# Patient Record
Sex: Female | Born: 1975 | Race: Black or African American | Hispanic: No | Marital: Single | Smoking: Never smoker
Health system: Southern US, Community
[De-identification: ages and names within clinical notes are randomized; demographics above are authoritative.]

## PROBLEM LIST (undated history)

## (undated) DIAGNOSIS — I10 Essential (primary) hypertension: Secondary | ICD-10-CM

## (undated) DIAGNOSIS — D869 Sarcoidosis, unspecified: Secondary | ICD-10-CM

## (undated) HISTORY — PX: APPENDECTOMY: SHX54

## (undated) HISTORY — PX: CHOLECYSTECTOMY: SHX55

---

## 2004-05-05 ENCOUNTER — Ambulatory Visit: Payer: Self-pay

## 2006-03-02 ENCOUNTER — Ambulatory Visit: Payer: Self-pay

## 2011-09-24 ENCOUNTER — Emergency Department: Payer: Self-pay | Admitting: Unknown Physician Specialty

## 2016-11-12 ENCOUNTER — Encounter (HOSPITAL_BASED_OUTPATIENT_CLINIC_OR_DEPARTMENT_OTHER): Payer: Self-pay | Admitting: *Deleted

## 2016-11-12 ENCOUNTER — Emergency Department (HOSPITAL_BASED_OUTPATIENT_CLINIC_OR_DEPARTMENT_OTHER)
Admission: EM | Admit: 2016-11-12 | Discharge: 2016-11-12 | Disposition: A | Discharge status: Home / Self Care | Payer: BC Managed Care – PPO | Attending: Emergency Medicine | Admitting: Emergency Medicine

## 2016-11-12 DIAGNOSIS — W57XXXA Bitten or stung by nonvenomous insect and other nonvenomous arthropods, initial encounter: Secondary | ICD-10-CM | POA: Insufficient documentation

## 2016-11-12 DIAGNOSIS — L299 Pruritus, unspecified: Secondary | ICD-10-CM | POA: Insufficient documentation

## 2016-11-12 DIAGNOSIS — I10 Essential (primary) hypertension: Secondary | ICD-10-CM | POA: Diagnosis not present

## 2016-11-12 DIAGNOSIS — Z79899 Other long term (current) drug therapy: Secondary | ICD-10-CM | POA: Diagnosis not present

## 2016-11-12 HISTORY — DX: Sarcoidosis, unspecified: D86.9

## 2016-11-12 HISTORY — DX: Essential (primary) hypertension: I10

## 2016-11-12 MED ORDER — HYDROCORTISONE 1 % EX CREA
TOPICAL_CREAM | Freq: Two times a day (BID) | CUTANEOUS | Status: DC
  Administered 2016-11-12: 07:00:00 via TOPICAL
  Filled 2016-11-12: qty 28

## 2016-11-12 MED ORDER — CETIRIZINE HCL 5 MG/5ML PO SOLN
10.0000 mg | Freq: Once | ORAL | Status: AC
  Administered 2016-11-12: 10 mg via ORAL
  Filled 2016-11-12: qty 10

## 2016-11-12 NOTE — ED Notes (Signed)
Pt verbalizes understanding of dc instructions and denies any further need at this time

## 2016-11-12 NOTE — ED Provider Notes (Signed)
[facility] Provider Note: Nicole Spurling, MD, FACEP  CSN: [id number] MRN: [id number] ARRIVAL: 11/12/16 at [place]: Polk is a 41 y.o. female who felt something bite the back of her right thigh while in bed this morning about 4 AM. She has had itching at the site but no pain. She is having no systemic symptoms such as fever, chills, nausea or vomiting. Symptoms are mild at the present time but she was to make sure that it does not get infected. She did not see what it was that bit her but thinks it may been a spider as she felt crawling on her skin.    Past Medical History:  Diagnosis Date  . Hypertension   . Sarcoidosis     Past Surgical History:  Procedure Laterality Date  . APPENDECTOMY    . CHOLECYSTECTOMY      History reviewed. No pertinent family history.  Social History  Substance Use Topics  . Smoking status: Never Smoker  . Smokeless tobacco: Never Used  . Alcohol use No    Prior to Admission medications   Medication Sig Start Date End Date Taking? Authorizing Provider  lisinopril (PRINIVIL,ZESTRIL) 30 MG tablet Take 30 mg by mouth daily.   Yes [provider]  Multiple Vitamins-Minerals (MULTIVITAMIN WITH MINERALS) tablet Take 1 tablet by mouth daily.   Yes [provider]    Allergies Patient has no allergy information on record.   REVIEW OF SYSTEMS  Negative except as noted here or in the History of Present Illness.   PHYSICAL EXAMINATION  Initial Vital Signs Blood pressure (!) 142/98, pulse 66, temperature 98.1 F (36.7 C), temperature source Oral, resp. rate 18, height 5' 10"  (1.778 m), weight 106.6 kg (235 lb), last menstrual period 11/01/2016, SpO2 100 %.  Examination General: Well-developed, well-nourished female in no acute distress; appearance consistent with age of record HENT: normocephalic; atraumatic Eyes: Normal  appearance Neck: supple Heart: regular rate and rhythm Lungs: clear to auscultation bilaterally Abdomen: soft; nondistended; nontender; bowel sounds present Extremities: No deformity; full range of motion; pulses normal Neurologic: Awake, alert and oriented; motor function intact in all extremities and symmetric; no facial droop Skin: Warm and dry; tiny puncture wound posterior right thigh with slight surrounding erythema, no foreign body seen Psychiatric: Normal mood and affect   RESULTS  Summary of this visit's results, reviewed by myself:   EKG Interpretation  Date/Time:    Ventricular Rate:    PR Interval:    QRS Duration:   QT Interval:    QTC Calculation:   R Axis:     Text Interpretation:        Laboratory Studies: No results found for this or any previous visit (from the past 24 hour(s)). Imaging Studies: No results found.  ED COURSE  Nursing notes and initial vitals signs, including pulse oximetry, reviewed.  Vitals:   11/12/16 0617  BP: (!) 142/98  Pulse: 66  Resp: 18  Temp: 98.1 F (36.7 C)  TempSrc: Oral  SpO2: 100%  Weight: 106.6 kg (235 lb)  Height: 5' 10"  (1.778 m)   Patient advised to use over-the-counter Zyrtec as needed for itching. We will provide hydrocortisone cream to use twice daily. She was also advised to apply Neosporin or other antibiotic cream or ointment several times a day to prevent infection.  PROCEDURES    ED DIAGNOSES  ICD-10-CM   1. Insect bite, initial encounter W57.Paulette Blanch, Jenny Reichmann, MD 11/12/16 [id number]

## 2016-11-12 NOTE — ED Triage Notes (Signed)
Pt woke this am with what she is concerned is a spider bite to the back of the right thigh. Denies fever N/V rashes difficulty breathing or facial swelling

## 2019-07-14 ENCOUNTER — Ambulatory Visit: Payer: BC Managed Care – PPO | Attending: Internal Medicine

## 2019-07-14 DIAGNOSIS — Z23 Encounter for immunization: Secondary | ICD-10-CM

## 2019-07-14 NOTE — Progress Notes (Signed)
   Covid-19 Vaccination Clinic  Name:  Nicole Brady    MRN: [id number] DOB: 24-Aug-1975  07/14/2019  [name] was observed post Covid-19 immunization for 15 minutes without incident. She was provided with Vaccine Information Sheet and instruction to access the V-Safe system.   [name] was instructed to call 911 with any severe reactions post vaccine: Marland Kitchen Difficulty breathing  . Swelling of face and throat  . A fast heartbeat  . A bad rash all over body  . Dizziness and weakness   Immunizations Administered    Name Date Dose VIS Date Route   Pfizer COVID-19 Vaccine 07/14/2019 10:00 AM 0.3 mL 04/06/2019 Intramuscular   Manufacturer: [employer], [employer]   Lot: [id number]   [place]: [id number]

## 2019-08-07 ENCOUNTER — Ambulatory Visit: Payer: BC Managed Care – PPO | Attending: Internal Medicine

## 2019-08-07 DIAGNOSIS — Z23 Encounter for immunization: Secondary | ICD-10-CM

## 2019-08-07 NOTE — Progress Notes (Signed)
   Covid-19 Vaccination Clinic  Name:  Nicole Brady    MRN: [id number] DOB: [birth year]  08/07/2019  [name] was observed post Covid-19 immunization for 15 minutes without incident. She was provided with Vaccine Information Sheet and instruction to access the V-Safe system.   [name] was instructed to call 911 with any severe reactions post vaccine: Marland Kitchen Difficulty breathing  . Swelling of face and throat  . A fast heartbeat  . A bad rash all over body  . Dizziness and weakness   Immunizations Administered    Name Date Dose VIS Date Route   Pfizer COVID-19 Vaccine 08/07/2019  3:16 PM 0.3 mL 04/06/2019 Intramuscular   Manufacturer: [place]   Lot: [id number]   [place]: [id number]

## 2021-02-25 ENCOUNTER — Other Ambulatory Visit: Payer: Self-pay

## 2021-02-25 ENCOUNTER — Ambulatory Visit (INDEPENDENT_AMBULATORY_CARE_PROVIDER_SITE_OTHER): Payer: BC Managed Care – PPO

## 2021-02-25 ENCOUNTER — Ambulatory Visit: Payer: BC Managed Care – PPO | Admitting: Podiatry

## 2021-02-25 DIAGNOSIS — M21612 Bunion of left foot: Secondary | ICD-10-CM

## 2021-02-25 DIAGNOSIS — M21611 Bunion of right foot: Secondary | ICD-10-CM | POA: Diagnosis not present

## 2021-02-25 DIAGNOSIS — M2041 Other hammer toe(s) (acquired), right foot: Secondary | ICD-10-CM

## 2021-02-25 DIAGNOSIS — M2042 Other hammer toe(s) (acquired), left foot: Secondary | ICD-10-CM

## 2021-02-25 DIAGNOSIS — R2242 Localized swelling, mass and lump, left lower limb: Secondary | ICD-10-CM

## 2021-03-01 NOTE — Progress Notes (Signed)
  Subjective:  Patient ID: Nicole Brady, female    DOB: May 07, 1975,  MRN: [id number]  Chief Complaint  Patient presents with   Bunions    NP - discuss surgical correction   Hammer Toe    45 y.o. female presents with the above complaint. History confirmed with patient.  She is here today to evaluate bilateral foot pain which she thinks may be bunions as well as possible hammertoes.  The left foot is much worse than the right. Objective:  Physical Exam: warm, good capillary refill, no trophic changes or ulcerative lesions, normal DP and PT pulses, and normal sensory exam.  No notable bunion or hammertoe deformity Left Foot: She has pain with patient in the plantar fascia   Radiographs: Multiple views x-ray of both feet: No acute osseous abnormalities Assessment:   1. Bilateral bunions   2. Hammertoes of both feet      Plan:  Patient was evaluated and treated and all questions answered.  She is a painful area worse in the left foot.  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  I recommended MRI to image it.  We discussed imaging prior to intervention whether that is injection or surgical resection.  She will return to see me after the MRI.  Return for call for appointment after MRI to review.

## 2021-03-24 ENCOUNTER — Other Ambulatory Visit: Payer: BC Managed Care – PPO

## 2021-03-26 ENCOUNTER — Ambulatory Visit
Admission: RE | Admit: 2021-03-26 | Discharge: 2021-03-26 | Disposition: A | Discharge status: Home / Self Care | Payer: BC Managed Care – PPO | Source: Ambulatory Visit | Attending: Podiatry | Admitting: Podiatry

## 2021-03-26 ENCOUNTER — Other Ambulatory Visit: Payer: Self-pay

## 2021-03-26 DIAGNOSIS — R2242 Localized swelling, mass and lump, left lower limb: Secondary | ICD-10-CM

## 2021-03-26 MED ORDER — GADOBENATE DIMEGLUMINE 529 MG/ML IV SOLN
20.0000 mL | Freq: Once | INTRAVENOUS | Status: AC | PRN
  Administered 2021-03-26: 20 mL via INTRAVENOUS

## 2021-04-01 ENCOUNTER — Other Ambulatory Visit: Payer: Self-pay

## 2021-04-01 ENCOUNTER — Encounter: Payer: Self-pay | Admitting: Podiatry

## 2021-04-01 ENCOUNTER — Ambulatory Visit: Payer: BC Managed Care – PPO | Admitting: Podiatry

## 2021-04-01 DIAGNOSIS — M21611 Bunion of right foot: Secondary | ICD-10-CM | POA: Diagnosis not present

## 2021-04-01 DIAGNOSIS — M21612 Bunion of left foot: Secondary | ICD-10-CM | POA: Diagnosis not present

## 2021-04-01 NOTE — Patient Instructions (Signed)
Look for silicone bunion pads or bunion shield on [employer] and wear this over your big toe to pad it in shoes

## 2021-04-02 ENCOUNTER — Encounter: Payer: Self-pay | Admitting: Podiatry

## 2021-04-02 NOTE — Progress Notes (Signed)
  Subjective:  Patient ID: Nicole Brady, female    DOB: [birth year],  MRN: [id number]  Chief Complaint  Patient presents with   Foot Pain    "It's doing okay.  It's not hurting as bad as when I first came in."    44 y.o. female presents with the above complaint. History confirmed with patient.  She completed the MRI she is doing much better than she was Objective:  Physical Exam: warm, good capillary refill, no trophic changes or ulcerative lesions, normal DP and PT pulses, and normal sensory exam.  No notable bunion or hammertoe deformity Left Foot: She has pain with patient in the plantar fascia   Radiographs: Multiple views x-ray of both feet: No acute osseous abnormalities  MRI normal without osseous or soft tissue abnormalities Assessment:   1. Bilateral bunions      Plan:  Patient was evaluated and treated and all questions answered.  Overall she is improved quite a bit.  I discussed with her that it still unclear exactly what the exact source of pain was.  She does have some tenderness over bunions that she has on both feet.  We discussed surgical and nonsurgical treatment I recommended offloading with silicone pads which she will try.  She will return to see me as needed for this or other issues  Return if symptoms worsen or fail to improve.

## 2022-11-27 ENCOUNTER — Ambulatory Visit
Admission: RE | Admit: 2022-11-27 | Discharge: 2022-11-27 | Disposition: A | Discharge status: Home / Self Care | Payer: BC Managed Care – PPO | Source: Ambulatory Visit

## 2022-11-27 VITALS — BP 171/90 | HR 61 | Temp 97.9°F | Resp 16 | Wt 205.0 lb

## 2022-11-27 DIAGNOSIS — M7651 Patellar tendinitis, right knee: Secondary | ICD-10-CM

## 2022-11-27 DIAGNOSIS — S46912A Strain of unspecified muscle, fascia and tendon at shoulder and upper arm level, left arm, initial encounter: Secondary | ICD-10-CM | POA: Diagnosis not present

## 2022-11-27 MED ORDER — PREDNISONE 20 MG PO TABS: 20.0000 mg | ORAL_TABLET | Freq: Every day | ORAL | 0 refills | Status: DC

## 2022-11-27 NOTE — ED Provider Notes (Signed)
[facility]    CSN: [id number] Arrival date & time: 11/27/22  1244      History   Chief Complaint Chief Complaint  Patient presents with   Arm Injury    I think I hurt my left arm lifting too heavy a weight about 3 weeks ago. I've iced it and taken Tylenol. Still hurts some of the time. - Entered by patient    HPI Nicole Brady is a 47 y.o. female with upper lateral and posterior arm pain since she was working out and increase the wt by 5 lbs 3 weeks ago. She has iced it, did less weight exercises and iced it, and is 50 % better, but has not resolved. The pain is provoked with arm movement. Does not have shoulder pain.  \ 2- sometimes gets pain above patella on thigh region anteriorly when she does work outs climbing, or steps. She takes Tylenol and ices it as needed. Is not painful today.   Past Medical History:  Diagnosis Date   Hypertension    Sarcoidosis     There are no problems to display for this patient.   Past Surgical History:  Procedure Laterality Date   APPENDECTOMY     CHOLECYSTECTOMY      OB History   No obstetric history on file.      Home Medications    Prior to Admission medications   Medication Sig Start Date End Date Taking? Authorizing Provider  ferrous sulfate 325 (65 FE) MG tablet Take by mouth.   Yes [provider]  lisinopril-hydrochlorothiazide (ZESTORETIC) 20-25 MG tablet Take 2 tablets by mouth daily. 11/18/20  Yes [provider]  Multiple Vitamins-Minerals (MULTIVITAMIN WITH MINERALS) tablet Take 1 tablet by mouth daily.   Yes [provider]  predniSONE (DELTASONE) 20 MG tablet Take 1 tablet (20 mg total) by mouth daily with breakfast. 11/27/22  Yes Rodriguez-Southworth, Nettie Elm, PA-C  lisinopril (PRINIVIL,ZESTRIL) 30 MG tablet Take 30 mg by mouth daily.    [provider]    Family History History reviewed. No pertinent family history.  Social History Social History   Tobacco  Use   Smoking status: Never   Smokeless tobacco: Never  Vaping Use   Vaping status: Never Used  Substance Use Topics   Alcohol use: No   Drug use: No     Allergies   Patient has no known allergies.   Review of Systems Review of Systems As noted in HPI  Physical Exam Triage Vital Signs Repeated BP 158/90  ED Triage Vitals  Encounter Vitals Group     BP 11/27/22 1254 (!) 171/90     Systolic BP Percentile --      Diastolic BP Percentile --      Pulse Rate 11/27/22 1254 61     Resp 11/27/22 1254 16     Temp 11/27/22 1254 97.9 F (36.6 C)     Temp Source 11/27/22 1254 Oral     SpO2 11/27/22 1254 100 %     Weight 11/27/22 1253 205 lb (93 kg)     Height --      Head Circumference --      Peak Flow --      Pain Score 11/27/22 1252 5     Pain Loc --      Pain Education --      Exclude from Growth Chart --    No data found.  Updated Vital Signs BP (!) 171/90 (BP Location: Left Arm)  Pulse 61   Temp 97.9 F (36.6 C) (Oral)   Resp 16   Wt 205 lb (93 kg)   SpO2 100%   BMI 29.41 kg/m   Visual Acuity Right Eye Distance:   Left Eye Distance:   Bilateral Distance:    Right Eye Near:   Left Eye Near:    Bilateral Near:     Physical Exam Vitals and nursing note reviewed.  Constitutional:      Appearance: She is not toxic-appearing.  HENT:     Right Ear: External ear normal.     Left Ear: External ear normal.  Eyes:     General: No scleral icterus.    Conjunctiva/sclera: Conjunctivae normal.  Pulmonary:     Effort: Pulmonary effort is normal.  Musculoskeletal:        General: Normal range of motion.     Cervical back: Neck supple.     Comments: L UE- has local tenderness on deltoid when arm is extended with palpation, not so much when not extended. And tenderness on distal triceps with palpation. No deformity notes. Strength is normal.   R KNEE- exam is normal, unable to provoke her pain  Skin:    General: Skin is warm and dry.     Findings: No  bruising or rash.  Neurological:     Mental Status: She is alert and oriented to person, place, and time.     Motor: No weakness.     Gait: Gait normal.     Deep Tendon Reflexes: Reflexes normal.  Psychiatric:        Mood and Affect: Mood normal.        Behavior: Behavior normal.        Thought Content: Thought content normal.        Judgment: Judgment normal.      UC Treatments / Results  Labs (all labs ordered are listed, but only abnormal results are displayed) Labs Reviewed - No data to display  EKG   Radiology No results found.  Procedures Procedures (including critical care time)  Medications Ordered in UC Medications - No data to display  Initial Impression / Assessment and Plan / UC Course  I have reviewed the triage vital signs and the nursing notes.  R Suprapatellar tendonitis L upper arm strain  I placed her on Prednisone for a few days since due to elevated BP, she should not have NSAID's right now.  See instructions   Final Clinical Impressions(s) / UC Diagnoses   Final diagnoses:  Muscle strain of left upper arm, initial encounter  Patellar tendinitis of right knee     Discharge Instructions      Follow up with your PCP, to have PT ordered for faster healing      ED Prescriptions     Medication Sig Dispense Auth. Provider   predniSONE (DELTASONE) 20 MG tablet Take 1 tablet (20 mg total) by mouth daily with breakfast. 5 tablet Rodriguez-Southworth, Nettie Elm, PA-C      PDMP not reviewed this encounter.   Garey Ham, PA-C 11/27/22 1414

## 2022-11-27 NOTE — Discharge Instructions (Signed)
Follow up with your PCP, to have PT ordered for faster healing

## 2022-11-27 NOTE — ED Triage Notes (Addendum)
  I think I hurt my left arm lifting too heavy a weight about 3 weeks ago. I've iced it and taken Tylenol. Still hurts some of the time  Right knee pain also.

## 2023-04-30 ENCOUNTER — Encounter: Payer: Self-pay | Admitting: Emergency Medicine

## 2023-04-30 ENCOUNTER — Ambulatory Visit
Admission: EM | Admit: 2023-04-30 | Discharge: 2023-04-30 | Disposition: A | Discharge status: Home / Self Care | Payer: 59 | Attending: Family Medicine | Admitting: Family Medicine

## 2023-04-30 DIAGNOSIS — U071 COVID-19: Secondary | ICD-10-CM | POA: Diagnosis not present

## 2023-04-30 LAB — RESP PANEL BY RT-PCR (FLU A&B, COVID) ARPGX2
Influenza A by PCR: NEGATIVE
Influenza B by PCR: NEGATIVE
SARS Coronavirus 2 by RT PCR: POSITIVE — AB

## 2023-04-30 LAB — GROUP A STREP BY PCR: Group A Strep by PCR: NOT DETECTED

## 2023-04-30 MED ORDER — PROMETHAZINE-DM 6.25-15 MG/5ML PO SYRP: 5.0000 mL | ORAL_SOLUTION | Freq: Four times a day (QID) | ORAL | 0 refills | Status: AC | PRN

## 2023-04-30 MED ORDER — NIRMATRELVIR/RITONAVIR (PAXLOVID)TABLET: ORAL_TABLET | ORAL | 0 refills | Status: AC

## 2023-04-30 NOTE — ED Triage Notes (Signed)
Patient c/o cough, sore throat, and nasal congestion that started yesterday.  Patient unsure of fevers.

## 2023-04-30 NOTE — ED Provider Notes (Signed)
 [facility]    CSN: [id number] Arrival date & time: 04/30/23  0830      History   Chief Complaint Chief Complaint  Patient presents with   Sore Throat   Cough   Nasal Congestion    HPI 48 year old female presents with respiratory symptoms.  Symptoms over the past 2 days.  Reports cough, sore throat, congestion.  No fever.  She has been using over-the-counter treatment without resolution.  Cough worse at night.  No known exacerbating factors.  No other associated symptoms.  No other complaints.  Home Medications    Prior to Admission medications   Medication Sig Start Date End Date Taking? Authorizing Provider  amLODipine (NORVASC) 5 MG tablet Take 5 mg by mouth daily. 03/03/23  Yes [provider]  nirmatrelvir /ritonavir  (PAXLOVID ) 20 x 150 MG & 10 x 100MG  TABS Take nirmatrelvir  (150 mg) two tablets twice daily for 5 days and ritonavir  (100 mg) one tablet twice daily for 5 days. GFR greater than 90. 04/30/23  Yes Kaulin Chaves G, DO  promethazine -dextromethorphan (PROMETHAZINE -DM) 6.25-15 MG/5ML syrup Take 5 mLs by mouth 4 (four) times daily as needed for cough. 04/30/23  Yes Abdulkadir Emmanuel G, DO  ferrous sulfate 325 (65 FE) MG tablet Take by mouth.    [provider]  lisinopril-hydrochlorothiazide (ZESTORETIC) 20-25 MG tablet Take 2 tablets by mouth daily. 11/18/20   [provider]  Multiple Vitamins-Minerals (MULTIVITAMIN WITH MINERALS) tablet Take 1 tablet by mouth daily.    [provider]    Family History History reviewed. No pertinent family history.  Social History Social History   Tobacco Use   Smoking status: Never   Smokeless tobacco: Never  Vaping Use   Vaping status: Never Used  Substance Use Topics   Alcohol use: No   Drug use: No     Allergies   Patient has no known allergies.   Review of Systems Review of Systems Per HPI  Physical Exam Triage Vital Signs ED Triage Vitals  Encounter Vitals Group      BP 04/30/23 0900 (!) 167/93     Systolic BP Percentile --      Diastolic BP Percentile --      Pulse Rate 04/30/23 0900 64     Resp 04/30/23 0900 14     Temp 04/30/23 0900 98.7 F (37.1 C)     Temp Source 04/30/23 0900 Oral     SpO2 04/30/23 0900 97 %     Weight 04/30/23 0859 210 lb (95.3 kg)     Height 04/30/23 0859 5' 10 (1.778 m)     Head Circumference --      Peak Flow --      Pain Score 04/30/23 0858 5     Pain Loc --      Pain Education --      Exclude from Growth Chart --    No data found.  Updated Vital Signs BP (!) 167/93 (BP Location: Left Arm) Comment: Patient has not taken her BP medicine today.  Pulse 64   Temp 98.7 F (37.1 C) (Oral)   Resp 14   Ht 5' 10 (1.778 m)   Wt 95.3 kg   LMP 04/02/2023 (Approximate)   SpO2 97%   BMI 30.13 kg/m   Visual Acuity Right Eye Distance:   Left Eye Distance:   Bilateral Distance:    Right Eye Near:   Left Eye Near:    Bilateral Near:     Physical Exam Vitals  and nursing note reviewed.  Constitutional:      General: She is not in acute distress.    Appearance: Normal appearance.  HENT:     Head: Normocephalic and atraumatic.     Right Ear: Tympanic membrane normal.     Left Ear: Tympanic membrane normal.     Mouth/Throat:     Pharynx: Oropharynx is clear.  Eyes:     General:        Right eye: No discharge.        Left eye: No discharge.     Conjunctiva/sclera: Conjunctivae normal.  Cardiovascular:     Rate and Rhythm: Normal rate and regular rhythm.  Pulmonary:     Effort: Pulmonary effort is normal.     Breath sounds: Normal breath sounds. No wheezing, rhonchi or rales.  Neurological:     Mental Status: She is alert.  Psychiatric:        Mood and Affect: Mood normal.        Behavior: Behavior normal.      UC Treatments / Results  Labs (all labs ordered are listed, but only abnormal results are displayed) Labs Reviewed  RESP PANEL BY RT-PCR (FLU A&B, COVID) ARPGX2 - Abnormal; Notable for the  following components:      Result Value   SARS Coronavirus 2 by RT PCR POSITIVE (*)    All other components within normal limits  GROUP A STREP BY PCR    EKG   Radiology No results found.  Procedures Procedures (including critical care time)  Medications Ordered in UC Medications - No data to display  Initial Impression / Assessment and Plan / UC Course  I have reviewed the triage vital signs and the nursing notes.  Pertinent labs & imaging results that were available during my care of the patient were reviewed by me and considered in my medical decision making (see chart for details).    48 year old female presents with respiratory symptoms.  COVID-19 testing positive.  Treating with Paxlovid .  Promethazine  DM for cough.  Final Clinical Impressions(s) / UC Diagnoses   Final diagnoses:  COVID   Discharge Instructions   None    ED Prescriptions     Medication Sig Dispense Auth. Provider   nirmatrelvir /ritonavir  (PAXLOVID ) 20 x 150 MG & 10 x 100MG  TABS Take nirmatrelvir  (150 mg) two tablets twice daily for 5 days and ritonavir  (100 mg) one tablet twice daily for 5 days. GFR greater than 90. 30 tablet Travarus Trudo G, DO   promethazine -dextromethorphan (PROMETHAZINE -DM) 6.25-15 MG/5ML syrup Take 5 mLs by mouth 4 (four) times daily as needed for cough. 118 mL Zackarie Chason G, DO      PDMP not reviewed this encounter.   Avelina Mcclurkin G, [place] 04/30/23 [phone]

## 2023-06-30 IMAGING — MR MR FOOT*L* WO/W CM
8 of 9 series · 33 of 40 positions shown · IV contrast (multihance)
Comparison: Radiograph 02/25/2021

CLINICAL DATA: Soft tissue mass, foot, US/xray nondiagnostic

EXAM:
MRI OF THE LEFT FOREFOOT WITHOUT AND WITH CONTRAST
TECHNIQUE: Multiplanar, multisequence MR imaging of the left forefoot was
performed both before and after administration of intravenous
contrast.
CONTRAST:  20mL MULTIHANCE GADOBENATE DIMEGLUMINE 529 MG/ML IV SOLN

[Series 4: T1 · coronal · 3.0mm · 0.19mm/px · 5 of 36 slices shown (1 of 2)]
[im 1/36]
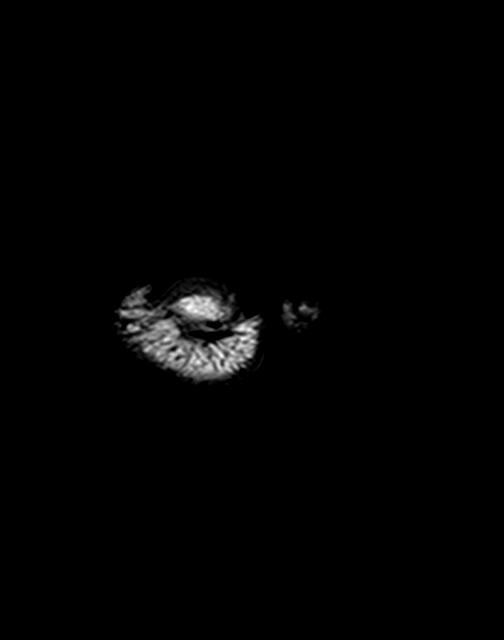
[im 9/36]
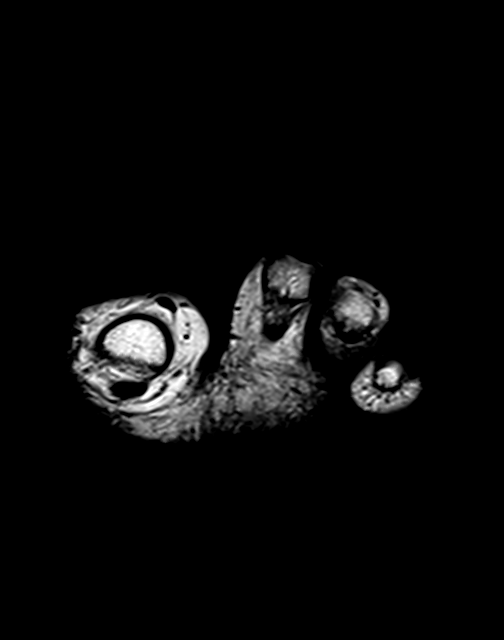
[im 18/36]
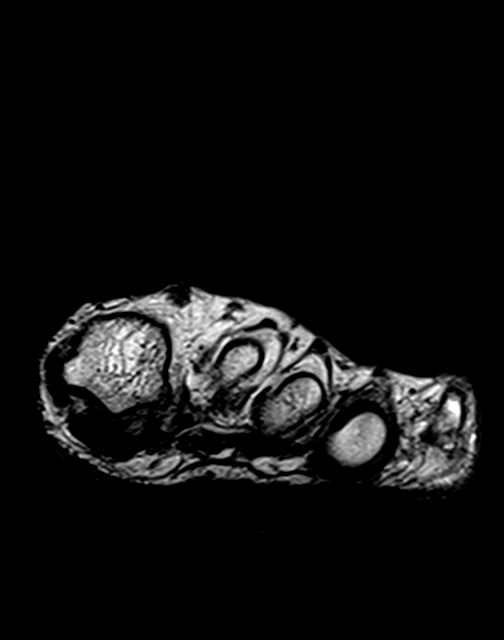
[im 27/36]
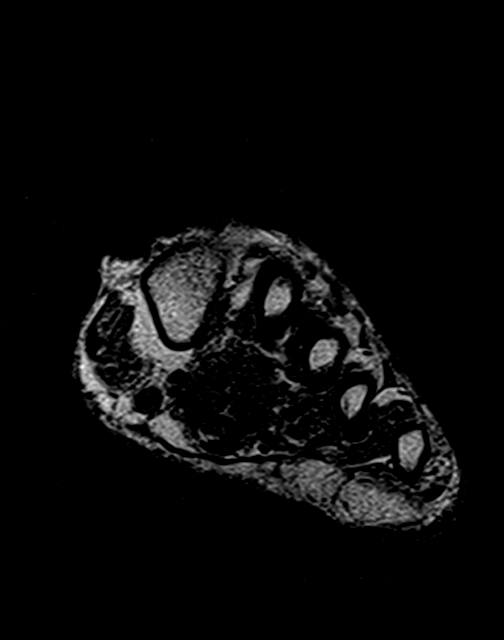
[im 36/36]
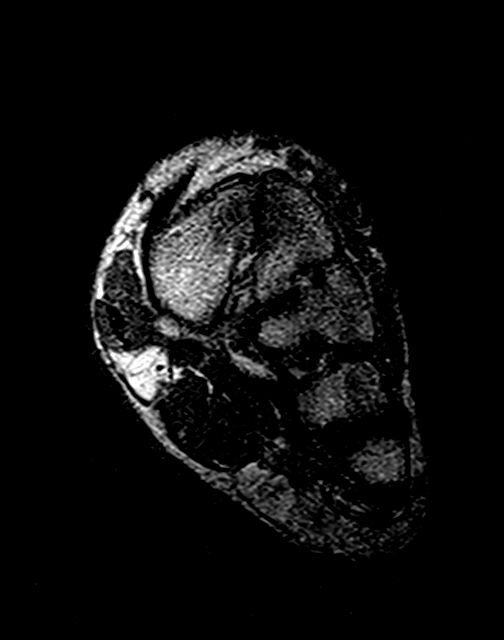

[Series 5: T2 fat-sat · coronal · 3.0mm · 0.23mm/px · 6 of 36 slices shown (1 of 2)]
[im 1/36]
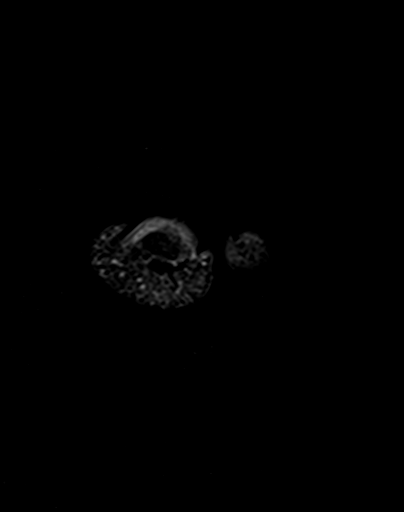
[im 8/36]
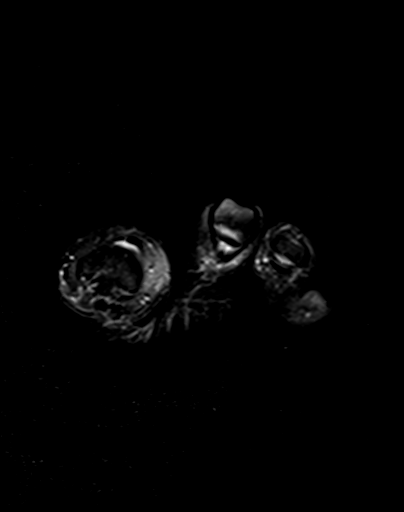
[im 15/36]
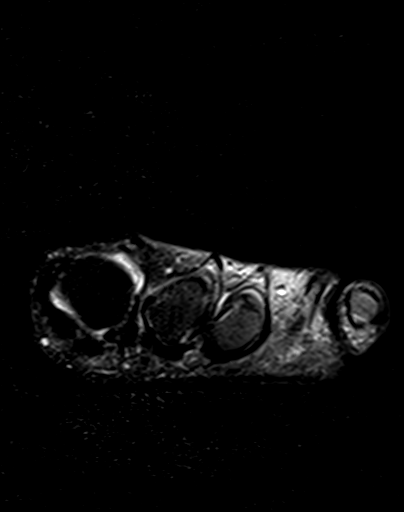
[im 22/36]
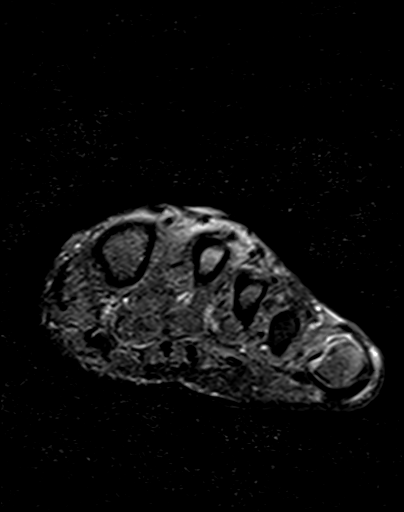
[im 29/36]
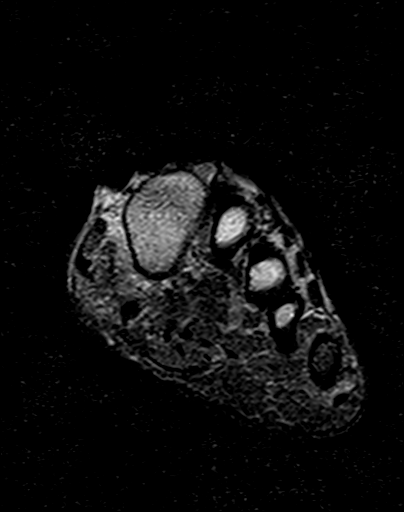
[im 36/36]
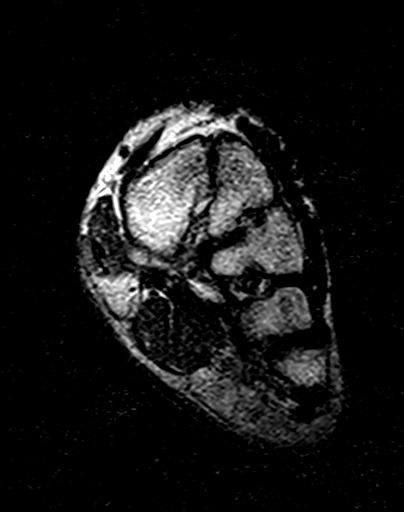

[Series 6: T2 fat-sat · axial · 3.0mm · 0.35mm/px · z∈[-80,-12]mm · 3 of 19 slices shown (2 of 2)]
[im 1/19]
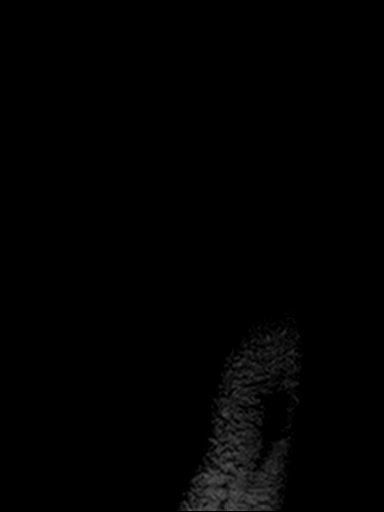
[im 10/19]
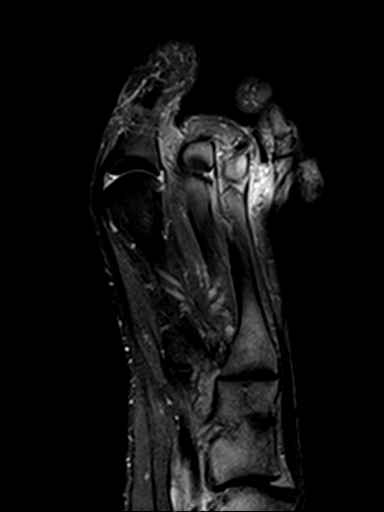
[im 19/19]
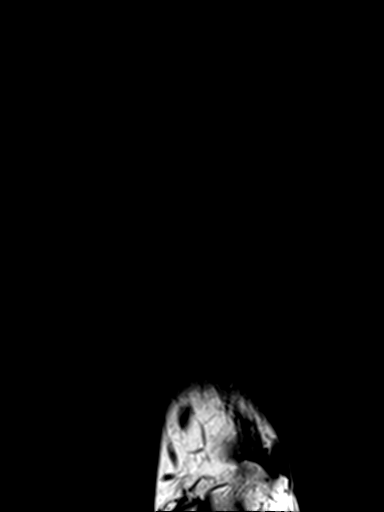

[Series 7: T1 · axial · 3.0mm · 0.35mm/px · z∈[-86,-7]mm · 3 of 22 slices shown (2 of 2)]
[im 1/22]
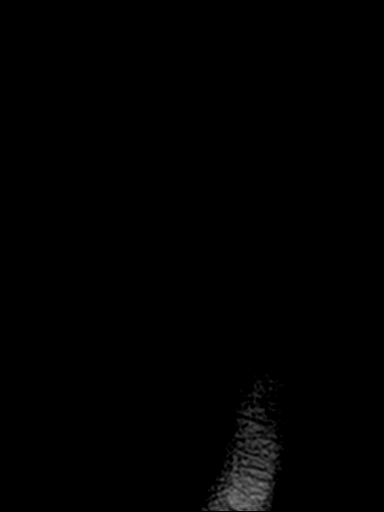
[im 11/22]
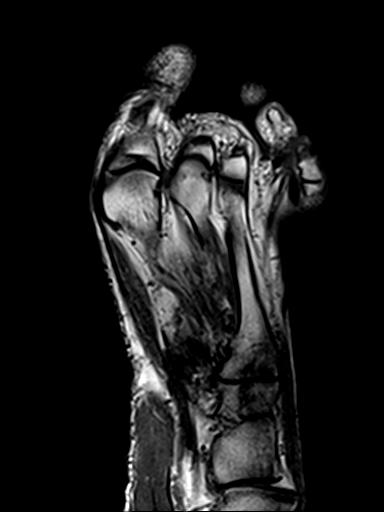
[im 22/22]
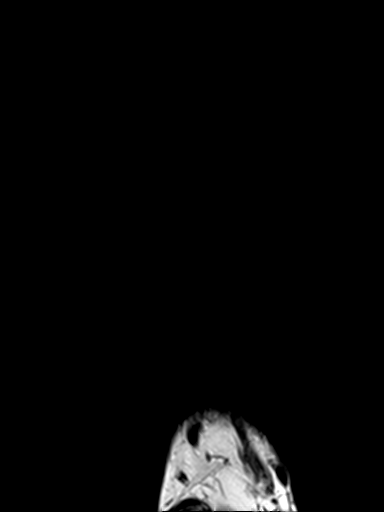

[Series 9: T1 fat-sat · coronal · non-contrast · 3.0mm · 0.62mm/px · 6 of 36 slices shown]
[im 1/36]
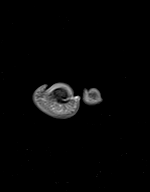
[im 8/36]
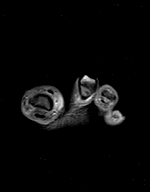
[im 15/36]
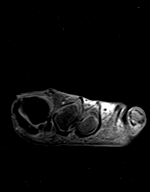
[im 22/36]
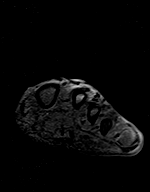
[im 29/36]
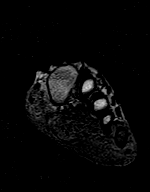
[im 36/36]
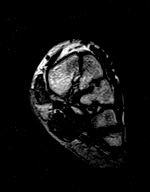

[Series 10: T1 fat-sat post-contrast · coronal · 3.0mm · 0.62mm/px · 6 of 36 slices shown (1 of 3)]
[im 1/36]
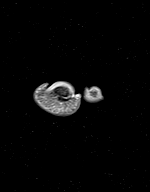
[im 8/36]
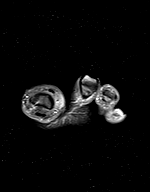
[im 15/36]
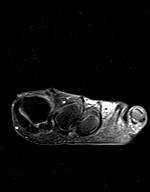
[im 22/36]
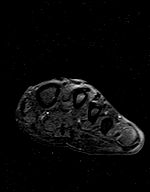
[im 29/36]
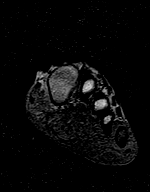
[im 36/36]
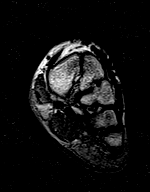

[Series 11: T1 fat-sat post-contrast · axial · 3.0mm · 0.35mm/px · z∈[-86,-7]mm · 3 of 22 slices shown (2 of 3)]
[im 1/22]
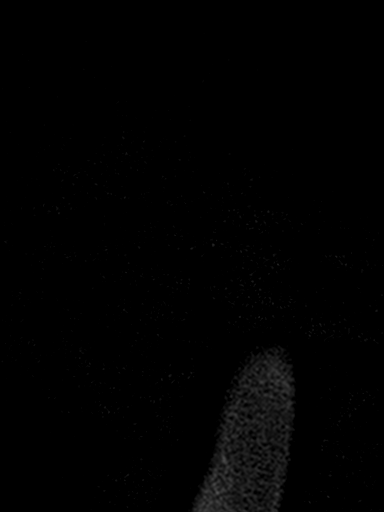
[im 11/22]
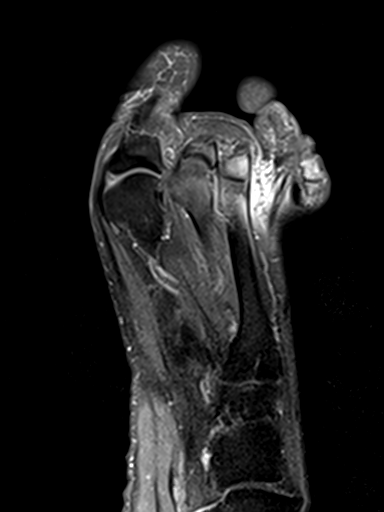
[im 22/22]
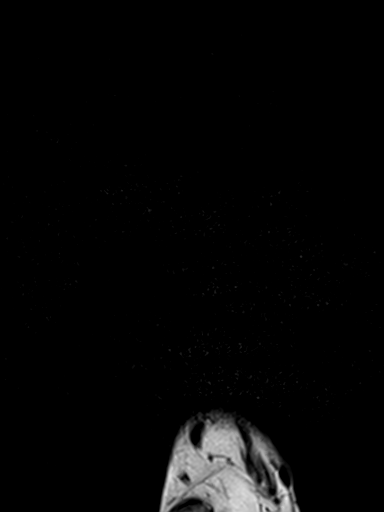

[Series 12: T1 fat-sat post-contrast · sagittal · 3.0mm · 0.47mm/px · 1 of 26 slices shown (3 of 3)]
[im 1/26]
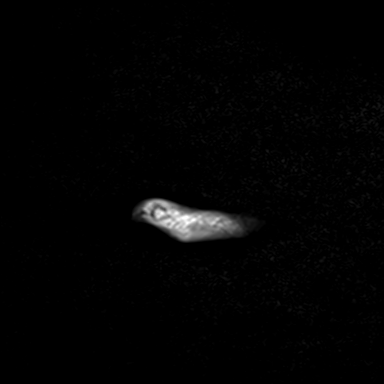

[33 of 40 positions shown; findings below may reference images not displayed]

FINDINGS: Bones/Joint/Cartilage

The cortex is intact. There is no significant marrow signal
alteration.

Ligaments

Intact Lisfranc ligament.  Intact collateral ligaments.

Muscles and Tendons

No intramuscular edema or muscle atrophy. No intramuscular
collection. No acute tendon abnormality.

Soft tissues

No evidence of cystic or solid mass. No evidence of intermetatarsal
neuroma or bursitis. There is ill-defined T1 hypointensity along the
plantar aspect of the second MTP joint, likely callus. No
inflammatory change. No abnormal enhancement.
IMPRESSION: No evidence of cystic or solid mass in the forefoot.

No evidence of intermetatarsal neuroma or bursitis.

No acute osseous abnormality.
